# Patient Record
Sex: Male | Born: 1980 | Race: White | Hispanic: No
Health system: Midwestern US, Academic
[De-identification: ages and names within clinical notes are randomized; demographics above are authoritative.]

---

## 2018-08-10 ENCOUNTER — Encounter: Admit: 2018-08-10 | Discharge: 2018-08-11

## 2018-08-15 ENCOUNTER — Encounter: Admit: 2018-08-15 | Discharge: 2018-08-15

## 2018-08-15 ENCOUNTER — Ambulatory Visit: Admit: 2018-08-15 | Discharge: 2018-08-16

## 2018-08-15 DIAGNOSIS — M549 Dorsalgia, unspecified: Secondary | ICD-10-CM

## 2018-08-15 DIAGNOSIS — Z1159 Encounter for screening for other viral diseases: Secondary | ICD-10-CM

## 2018-08-15 DIAGNOSIS — S62616A Displaced fracture of proximal phalanx of right little finger, initial encounter for closed fracture: Principal | ICD-10-CM

## 2018-08-15 DIAGNOSIS — F419 Anxiety disorder, unspecified: Secondary | ICD-10-CM

## 2018-08-15 DIAGNOSIS — I82409 Acute embolism and thrombosis of unspecified deep veins of unspecified lower extremity: Secondary | ICD-10-CM

## 2018-08-15 DIAGNOSIS — F329 Major depressive disorder, single episode, unspecified: Secondary | ICD-10-CM

## 2018-08-15 NOTE — Progress Notes
Date of Service: 08/15/2018          History of Present Illness:  Willie Camacho is a pleasant 38 year old, right-hand dominant, self-employed Corporate investment banker.  He presents for evaluation of right small finger injury.  On 08/10/2018 he caught a football awkwardly injuring the finger.  He has had some previous metacarpal problems and describes a surgical procedure elsewhere more than five years ago.  He seemed to recover from this relatively well.  He was evaluated in the emergency department in [place], [place] and x-rays demonstrated a proximal phalanx fracture.  He has been using an AlumaFoam splint.  He notes no numbness or tingling.    Medical History:   Diagnosis Date   ??? Anxiety    ??? Back pain    ??? Deep vein thrombosis (HCC) 2018   ??? Depression        Surgical History:   Procedure Laterality Date   ??? ELBOW SURGERY  2015   ??? HAND SURGERY  2015       Family History   Problem Relation Age of Onset   ??? Diabetes Grandparent        Social History     Occupational History   ??? Not on file   Tobacco Use   ??? Smoking status: Current Some Day Smoker   ??? Smokeless tobacco: Never Used   Substance and Sexual Activity   ??? Alcohol use: Never     Frequency: Never   ??? Drug use: Never   ??? Sexual activity: Not on file       No Known Allergies        Review of Systems   Musculoskeletal: Positive for back pain.        Pain scale 5/10         Objective:         ??? bupropion HCl (WELLBUTRIN PO) Take  by mouth.   ??? busPIRone (BUSPAR) 5 mg tablet Take 5 mg by mouth three times daily.     Vitals:    08/15/18 1009   BP: 127/89   Pulse: 70   Weight: 74.8 kg (165 lb)   Height: 177.8 cm (70)     Body mass index is 23.68 kg/m???.     Physical Exam  Ortho Exam  Alert male, no acute distress.  He is appropriately dressed and interactive.  Oriented x 3.  On evaluation of the patient???s right hand, there is an obvious clinical deformity.  With composite flexion there is rotational deformity producing ulnar deviation of the finger.  The finger is well perfused.  Sensation is grossly intact to light touch in the median, ulnar, and radial nerve distributions.  Dorsal healed incision is present over the 5th metacarpal.    IMAGING:  X-rays obtained 08/10/2018 were reviewed.  There is a displaced oblique fracture of the proximal phalanx.       Assessment and Plan:    #1  Right small finger displaced proximal phalanx fracture    I reviewed with Willie Camacho that given the configuration of the fracture and his exam today, I would recommend that he consider open reduction internal fixation given the degree of rotational malalignment.  He was in agreement.  We reviewed the risks, benefits, goals, and alternatives to surgery.  He has indicated he would like to proceed.  He will be returned to an AlumaFoam splint today.  All of his questions were answered.  Dictated by Diego Cory, MD and transcribed via [employer]. Copied and pasted into O2 by Baldo Ash, 08/18/2018 9:47 AM

## 2018-08-15 NOTE — Progress Notes
Patient arrived to [facility] for COVID-19 testing 08/15/18 1131. Patient identity confirmed via photo I.D. Nasopharyngeal procedure explained to the patient.   Nasopharyngeal swab completed left  Patient education provided given and instructed patient self isolate until contacted w/ results and further instructions. CDC handout on COVID-19 given to patient.   [email or web address]    Swab collected by Leighton Parody, RN.    Date symptoms began/reason for testing: pre op

## 2018-08-15 NOTE — Pre-Anesthesia Patient Instructions
GENERAL INFORMATION    Before you coe to the hospital  ??? Make arrangeents for a responsible adult to drive you hoe and stay with you for 24 hours following surgery.  ??? Bath/Shower Instructions  ??? Please refer to Pre-Surgery Shower Instruction sheet.  ??? Leave oney, credit cards, jewelry, and any other valuables at hoe. [facility] is not responsible for the loss or breakage of personal ites.  ??? Reove nail polish fro surgery site/extreity, akeup and all jewelry (including piercings) before coing to the hospital.  ??? The orning of your procedure:  ??? brush your teeth and tongue  ??? do not soke  ??? do not shave the area where you will have surgery    What to bring to the hospital  ??? ID/ Insurance Card  ??? Medical Device card  ??? Official docuents for legal guardianship   ??? Copy of your Living Will, Advanced Directives, and/or Durable Power of Attorney   ??? Sall bag with a few personal belongings  ??? Cases for glasses/hearing aids/contact lens (bring solutions for contacts)  ??? Dress in clean, loose, cofortable clothing   ??? Other personal ites such as canes, walkers, and edications in original containers if applicable  ??? CPAP or BiPAP Machine: If it is a Philips/Respironics Syste One achine please bring achine/huidifier and tubing/ask. All other brands, please bring tubing/ask only on the day of surgery.     Eating or drinking before surgery  ??? Do not eat anything after 11:00 p.. the day before your procedure (including gu, ints, candy, or chewing tobacco).  ??? Other instructions: You ay have water until  * day of surgery.     Other instructions  Notify your surgeon if:  ??? there is a possibility that you are pregnant  ??? you becoe ill with a cough, fever, sore throat, nausea, voiting or flu-like syptos  ??? you have any open wounds/sores that are red, painful, draining, or are new since you last saw  the doctor  ??? you need to cancel your procedure Notify us at [facility]: [phone]   ??? if you need to cancel your procedure  ??? if you are going to be late    Arrival at the hospital  [place] - Your surgery is scheduled on 08-19-18 at 1315.  Please arrive at 1115.  ??? The [address] is located at [facility]. This is at the [facility] of [address]  ??? Use the ain entrance of the hospital, at the east side of the building. Parking is free.  ??? Check-in for surgery is inside the ain entrance.

## 2018-08-16 ENCOUNTER — Encounter: Admit: 2018-08-15 | Discharge: 2018-08-16

## 2018-08-16 DIAGNOSIS — S62616A Displaced fracture of proximal phalanx of right little finger, initial encounter for closed fracture: Principal | ICD-10-CM

## 2018-08-16 NOTE — Progress Notes
Contacted patient and confirmed name and DOB. Patient advised that COVID-19 test results are negative. Advised that patient can continue with the procedure and should follow pre-procedure instructions. Advised patient to continue with home quarantine until procedure. Advised that if they develop any concerning symptoms prior to the procedure to contact their procedure team, specialist, and/or PCP for assistance.

## 2018-08-18 ENCOUNTER — Encounter: Admit: 2018-08-18 | Discharge: 2018-08-18

## 2018-08-19 ENCOUNTER — Ambulatory Visit: Admit: 2018-08-19 | Discharge: 2018-08-19

## 2018-08-19 ENCOUNTER — Encounter: Admit: 2018-08-19 | Discharge: 2018-08-19

## 2018-08-19 ENCOUNTER — Encounter: Admit: 2018-08-19 | Discharge: 2018-08-20

## 2018-08-19 DIAGNOSIS — Z86718 Personal history of other venous thrombosis and embolism: Secondary | ICD-10-CM

## 2018-08-19 DIAGNOSIS — F419 Anxiety disorder, unspecified: Secondary | ICD-10-CM

## 2018-08-19 DIAGNOSIS — Z79899 Other long term (current) drug therapy: Secondary | ICD-10-CM

## 2018-08-19 DIAGNOSIS — M549 Dorsalgia, unspecified: Secondary | ICD-10-CM

## 2018-08-19 DIAGNOSIS — F17200 Nicotine dependence, unspecified, uncomplicated: Secondary | ICD-10-CM

## 2018-08-19 DIAGNOSIS — I82409 Acute embolism and thrombosis of unspecified deep veins of unspecified lower extremity: Secondary | ICD-10-CM

## 2018-08-19 DIAGNOSIS — S62616A Displaced fracture of proximal phalanx of right little finger, initial encounter for closed fracture: Principal | ICD-10-CM

## 2018-08-19 DIAGNOSIS — F329 Major depressive disorder, single episode, unspecified: Secondary | ICD-10-CM

## 2018-08-19 MED ORDER — LACTATED RINGERS IV SOLP
INTRAVENOUS | 0 refills | Status: DC
Start: 2018-08-19 — End: 2018-08-19
  Administered 2018-08-19: 16:00:00 1000.000 mL via INTRAVENOUS

## 2018-08-19 MED ORDER — OXYCODONE 5 MG PO TAB
5-10 mg | Freq: Once | ORAL | 0 refills | Status: DC | PRN
Start: 2018-08-19 — End: 2018-08-19

## 2018-08-19 MED ORDER — FENTANYL CITRATE (PF) 50 MCG/ML IJ SOLN
0 refills | Status: DC
Start: 2018-08-19 — End: 2018-08-19
  Administered 2018-08-19 (×2): 25 ug via INTRAVENOUS

## 2018-08-19 MED ORDER — FENTANYL CITRATE (PF) 50 MCG/ML IJ SOLN
25 ug | INTRAVENOUS | 0 refills | Status: DC | PRN
Start: 2018-08-19 — End: 2018-08-19

## 2018-08-19 MED ORDER — CEFAZOLIN 1 GRAM IJ SOLR
0 refills | Status: DC
Start: 2018-08-19 — End: 2018-08-19
  Administered 2018-08-19: 18:00:00 2 g via INTRAVENOUS

## 2018-08-19 MED ORDER — LIDOCAINE (PF) 200 MG/10 ML (2 %) IJ SYRG
0 refills | Status: DC
Start: 2018-08-19 — End: 2018-08-19
  Administered 2018-08-19: 18:00:00 100 mg via INTRAVENOUS

## 2018-08-19 MED ORDER — HYDROCODONE-ACETAMINOPHEN 5-325 MG PO TAB
1-2 | ORAL_TABLET | ORAL | 0 refills | 30.00000 days | Status: AC | PRN
Start: 2018-08-19 — End: ?
  Filled 2018-08-19: qty 30, 3d supply, fill #1

## 2018-08-19 MED ORDER — BUPIVACAINE 0.25 % (2.5 MG/ML) IJ SOLN
0 refills | Status: DC
Start: 2018-08-19 — End: 2018-08-19
  Administered 2018-08-19: 19:00:00 9 mL via INTRAMUSCULAR

## 2018-08-19 MED ORDER — LIDOCAINE (PF) 10 MG/ML (1 %) IJ SOLN
.2 mL | Freq: Once | INTRAMUSCULAR | 0 refills | Status: CP
Start: 2018-08-19 — End: ?
  Administered 2018-08-19: 16:00:00 0.2 mL via INTRAMUSCULAR

## 2018-08-19 MED ORDER — MIDAZOLAM 1 MG/ML IJ SOLN
INTRAVENOUS | 0 refills | Status: DC
Start: 2018-08-19 — End: 2018-08-19
  Administered 2018-08-19: 18:00:00 2 mg via INTRAVENOUS

## 2018-08-19 MED ORDER — PROPOFOL 10 MG/ML IV EMUL 20 ML (INFUSION)(AM)(OR)
INTRAVENOUS | 0 refills | Status: DC
Start: 2018-08-19 — End: 2018-08-19
  Administered 2018-08-19: 18:00:00 60 ug/kg/min via INTRAVENOUS

## 2018-08-19 MED ORDER — PROPOFOL INJ 10 MG/ML IV VIAL
0 refills | Status: DC
Start: 2018-08-19 — End: 2018-08-19
  Administered 2018-08-19: 18:00:00 30 mg via INTRAVENOUS

## 2018-08-19 MED ORDER — LACTATED RINGERS IV SOLP
1000 mL | INTRAVENOUS | 0 refills | Status: DC
Start: 2018-08-19 — End: 2018-08-19

## 2018-08-19 NOTE — Anesthesia Post-Procedure Evaluation
Post-Anesthesia Evaluation    Name: Willie Camacho      MRN: [id number]     DOB: 13-Oct-1980     Age: 38 y.o.     Sex: male   __________________________________________________________________________     Procedure Information     Anesthesia Start Date/Time:  08/19/18 1243    Procedure:  OPEN REDUCTION INTERNAL FIXATION RIGHT PROXIMAL PHALANX FRACTURE (Right Fingers)    Location:  [facility] OR 2 / [facility] OR/PERIOP    Surgeon:  Laveda Abbe, MD          Post-Anesthesia Vitals  BP: 125/94 (08/11 1415)  Temp: 36.7 C (98 F) (08/11 1350)  Pulse: 69 (08/11 1415)  Respirations: 22 PER MINUTE (08/11 1415)  SpO2: 98 % (08/11 1415)  SpO2 Pulse: 72 (08/11 1415)  Height: 172.7 cm (68") (08/11 1030)   Vitals Value Taken Time   BP 125/94 08/19/2018  2:15 PM   Temp 36.7 C (98 F) 08/19/2018  1:50 PM   Pulse 69 08/19/2018  2:15 PM   Respirations 22 PER MINUTE 08/19/2018  2:15 PM   SpO2 98 % 08/19/2018  2:15 PM         Post Anesthesia Evaluation Note    Evaluation location: Pre/Post  Patient participation: recovered; patient participated in evaluation  Level of consciousness: alert    Pain score: 2  Pain management: adequate    Hydration: normovolemia  Temperature: 36.0C - 38.4C  Airway patency: adequate    Perioperative Events       Post-op nausea and vomiting: no PONV    Postoperative Status  Cardiovascular status: hemodynamically stable  Respiratory status: spontaneous ventilation  Follow-up needed: none        Perioperative Events  Perioperative Event: No  Emergency Case Activation: No

## 2018-08-19 NOTE — Care Plan
Goals ongoing.

## 2018-08-19 NOTE — Patient Instructions
Discharge Instructions: After Your Surgery  You've just had surgery. During surgery, you were given medicine called anesthesia to keep you relaxed and free of pain. After surgery, you may have some pain or nausea. This is common. Here are some tips for feeling better and getting well after surgery.     Stay on schedule with your medicine.   Going home  Your healthcare provider will show you how to take care of yourself when you go home. He or she will also answer your questions. Have an adult family member or friend drive you home. For the first 24 hours after your surgery:   Don't drive or use heavy equipment.   Don't make important decisions or sign legal papers.   Don't drink alcohol.   Have someone stay with you, if needed. He or she can watch for problems and help keep you safe.  Be sure to go to all follow-up visits with your healthcare provider. And rest after your surgery for as long as your healthcare provider tells you to.  Coping with pain  If you have pain after surgery, pain medicine will help you feel better. Take it as told, before pain becomes severe. Also, ask your healthcare provider or pharmacist about other ways to control pain. This might be with heat, ice, or relaxation. And follow any other instructions your surgeon or nurse gives you.  Tips for taking pain medicine  To get the best relief possible, remember these points:   Pain medicines can upset your stomach. Taking them with a little food may help.   Most pain relievers taken by mouth need at least 20 to 30 minutes to start to work.   Don't wait till your pain becomes severe before you take your medicine. Try to time your medicine so that you can take it before starting an activity. This might be before you get dressed, go for a walk, or sit down for dinner.   Constipation is a common side effect of pain medicines. Call your healthcare provider before taking any medicines such as laxatives or stool softeners to help ease  constipation. Also ask if you should skip any foods. Drinkinglots of fluids andeating foodssuch as fruits and vegetables that are high in fiber can also help. Remember, don't take laxatives unless your surgeon has prescribed them.   Drinking alcohol and taking pain medicine can cause dizziness and slow your breathing. It can even be deadly. Don't drink alcohol while taking pain medicine.   Pain medicine can make you react more slowly to things. Don't drive or run machinery while taking pain medicine.  Your healthcare providermay tell you to take acetaminophen to help ease your pain. Ask him or her how much you are supposed to take each day. Acetaminophen or other pain relievers may interact with your prescription medicines or other over-the-counter (OTC) medicines. Some prescription medicines have acetaminophen and other ingredients.Using both prescription and OTC acetaminophenfor paincan cause you to overdose. Readthe labels on your OTC medicineswith care. This will help youto clearly know the list of ingredients, how much to take, and anywarnings. It may also help you not take too muchacetaminophen.If you have questions or don't understand the information, ask your pharmacist or healthcare provider to explain it to you before you take the OTC medicine.  Managing nausea  Some people have an upset stomach after surgery. This is often because of anesthesia, pain, or pain medicine, or the stress of surgery. These tips will help you handle nausea and eat   healthy foods as you get better. If you were on a special food plan before surgery, ask your healthcare provider if you should follow it while you get better. These tips may help:   Don't push yourself to eat. Your body will tell you when to eat and how much.   Start off with clear liquids and soup. They are easier to digest.   Next try semi-solid foods, such as mashed potatoes, applesauce, and gelatin, as you feel ready.   Slowly move to solid  foods. Don't eat fatty, rich, or spicy foods at first.   Don't force yourself to have 3 large meals a day. Instead eat smaller amounts more often.   Take pain medicines with a small amount of solid food, such as crackers or toast, to prevent nausea.  When to call your healthcare provider  Call your healthcare provider if:   You still have intolerable pain an hour after taking medicine. The medicine may not be strong enough.   You feel too sleepy, dizzy, or groggy. The medicine may be too strong.   You have side effects such as nausea or vomiting, or skin changes such as rash, itching, or hives.Your healthcare provider may suggest other medicines to control side effects.  Rash, itching, or hives may mean you have an allergic reaction. Report this right away. If you have trouble breathing or facial swelling, call 911 right away.  If you have obstructive sleep apnea  You were given anesthesia medicine during surgery to keep you comfortable and free of pain. After surgery, you may have more apnea spells because of this medicine and other medicines you were given. The spells may last longer than usual.  At home:   Keep using the continuous positive airway pressure (CPAP) device when you sleep. Unless your healthcare provider tells you not to, use it when you sleep, day or night. CPAP is a common device used to treat obstructive sleep apnea.   Talk with your provider before taking any pain medicine, muscle relaxants, or sedatives. Your provider will tell you about the possible dangers of taking these medicines.  [employer] last reviewed this educational content on 03/08/2017   2000-2020 [employer] [address], [place] [address]. All rights reserved. This information is not intended as a substitute for professional medical care. Always follow your healthcare professional's instructions.

## 2018-08-19 NOTE — Anesthesia Pre-Procedure Evaluation
Anesthesia Pre-Procedure Evaluation    Name: Willie Camacho      MRN: [id number]     DOB: 1980-04-24     Age: 38 y.o.     Sex: male   _________________________________________________________________________     Procedure Info:   Procedure Information     Date/Time:  08/19/18 1229    Procedure:  OPEN REDUCTION INTERNAL FIXATION RIGHT PROXIMAL PHALANX FRACTURE (Right )    Location:  [facility]    Surgeon:  Diego Cory, MD          Physical Assessment  Vital Signs (last filed in past 24 hours):  BP: 128/95 (08/11 1050)  Temp: 36.8 ???C (98.2 ???F) (08/11 1030)  Pulse: 78 (08/11 1030)  Respirations: 20 PER MINUTE (08/11 1030)  SpO2: 96 % (08/11 1030)  Height: 172.7 cm (68) (08/11 1030)  Weight: 75.8 kg (167 lb) (08/11 1030)      Patient History   No Known Allergies     Current Medications    Medication Directions   buPROPion XL (WELLBUTRIN XL) 150 mg tablet Take 150 mg by mouth every morning. Do not crush or chew.   busPIRone (BUSPAR) 5 mg tablet Take 5 mg by mouth three times daily.   tiZANidine (ZANAFLEX) 4 mg tablet Take 4 mg by mouth every 8 hours as needed for Spasms.   traZODone (DESYREL) 50 mg tablet Take 50 mg by mouth at bedtime as needed for Sleep.         Review of Systems/Medical History      Patient summary reviewed  Pertinent labs reviewed    PONV Screening: Postoperative opioids      Airway - negative        Pulmonary       Current smoker; patient did not smoke on day of surgery        Cardiovascular         Exercise tolerance: >4 METS      Beta Blocker therapy: No      Beta blockers within 24 hours: n/a      DVT      GI/Hepatic/Renal - negative        Neuro/Psych           Psychiatric history          Depression      Musculoskeletal         Back pain      Endocrine/Other - negative     Physical Exam    Airway Findings      Mallampati: II      TM distance: >3 FB      Neck ROM: full      Mouth opening: good      Airway patency: adequate    Dental Findings: Comments: No loose, chipped, or missing teeth.    Cardiovascular Findings: Negative      Pulmonary Findings: Negative      Abdominal Findings: Negative      Neurological Findings: Negative      Constitutional findings: Negative       Diagnostic Tests  Hematology: No results found for: HGB, HCT, PLTCT, WBC, NEUT, ANC, LYMPH, ALC, ABSLYMPHCT, MONA, AMC, EOSA, ABC, BASOPHILS, MCV, MCH, MCHC, MPV, RDW      General Chemistry: No results found for: NA, K, CL, CO2, GAP, BUN, CR, GLU, CA, KETONES, ALBUMIN, LACTIC, OBSCA, MG, TOTBILI, TOTBILCB, PO4   Coagulation: No results found for: PT, PTT, INR  Anesthesia Plan    ASA score: 1   Plan: MAC  Induction method: intravenous  NPO status: acceptable      Informed Consent  Anesthetic plan and risks discussed with patient.  Use of blood products discussed with patient  Blood Consent: consented      Plan discussed with: anesthesiologist and CRNA.  Comments: (Pt seen and identified in holding, history and physical performed, all questions answered. Plan for mac + digit block by surgeon.  Pt understands the possibility of converting to GA as backup. Risks of GA with LMA/ETT including sore throat, oral/dental injury, allergic reactions, PONV, aspiration, respiratory failure, MI, CVA discussed with patient who reports understanding and consents to anesthetic plan.)

## 2018-08-19 NOTE — Other
Brief Operative Note    Name: Willie Camacho is a 38 y.o. male     DOB: 11-27-1980             MRN#: [id number]  DATE OF OPERATION: 08/19/2018    Date:  08/19/2018        Preoperative Dx:   Closed displaced fracture of proximal phalanx of right little finger, initial encounter [S62.616A]    Post-op Diagnosis      * Closed displaced fracture of proximal phalanx of right little finger, initial encounter [S62.616A]    Procedure(s) (LRB):  OPEN REDUCTION INTERNAL FIXATION RIGHT PROXIMAL PHALANX FRACTURE (Right)    Anesthesia Type: Defer to Anesthesia    Surgeon(s) and Role:     * Laveda Abbe, MD - Primary     * Sherlon Handing, PA-C - Assisting     Evonnie Dawes, MD - Resident - Assisting      Findings:  Small finger proximal phalanx fracture    Estimated Blood Loss: No blood loss documented.     Specimen(s) Removed/Disposition: * No specimens in log *    Complications:  None    Implants: screws x 2    Drains: None    Disposition:  PACU - stable    Sherlon Handing, PA-C  Pager [phone]

## 2018-08-19 NOTE — H&P (View-Only)
[facility] History & Physical Note      Admission Date: 08/19/2018                                                  Chief Complaint:  Right small finger displaced proximal phalanx fracture    Assessment/Plan    Plan to proceed to OR today for ORIF right small finger proximal phalanx fracture  ______________________________________________________________________    History of Present Illness:Mr. Willie Camacho is a pleasant 38 year old, right-hand dominant, self-employed Corporate investment banker.  He presents for evaluation of right small finger injury.  On 08/10/2018 he caught a football awkwardly injuring the finger.  He has had some previous metacarpal problems and describes a surgical procedure elsewhere more than five years ago.  He seemed to recover from this relatively well.  He was evaluated in the emergency department in [place], [place] and x-rays demonstrated a proximal phalanx fracture.  He has been using an AlumaFoam splint.  He notes no numbness or tingling.    Medical History:   Diagnosis Date   ??? Anxiety    ??? Back pain    ??? Deep vein thrombosis (HCC) 2018   ??? Depression        Surgical History:   Procedure Laterality Date   ??? ELBOW SURGERY  2015   ??? HAND SURGERY  2015       Social History     Socioeconomic History   ??? Marital status: Divorced     Spouse name: Not on file   ??? Number of children: Not on file   ??? Years of education: Not on file   ??? Highest education level: Not on file   Occupational History   ??? Not on file   Tobacco Use   ??? Smoking status: Current Some Day Smoker     Years: 4.00   ??? Smokeless tobacco: Never Used   Substance and Sexual Activity   ??? Alcohol use: Never     Frequency: Never   ??? Drug use: Never   ??? Sexual activity: Not on file   Other Topics Concern   ??? Not on file   Social History Narrative   ??? Not on file       Family History   Problem Relation Age of Onset   ??? Diabetes Grandparent        Allergies: No Known Allergies      Outpatient Medications as of 08/19/2018 Medication Sig Dispense Refill   ??? buPROPion XL (WELLBUTRIN XL) 150 mg tablet Take 150 mg by mouth every morning. Do not crush or chew.     ??? tiZANidine (ZANAFLEX) 4 mg tablet Take 4 mg by mouth every 8 hours as needed for Spasms.     ??? traZODone (DESYREL) 50 mg tablet Take 50 mg by mouth at bedtime as needed for Sleep.           Review of Systems:  Musculoskeletal:positive for bone pain  Cardio: denies chest pain  Respiratory: denies shortness of breath     Blood pressure (!) 128/95, pulse 78, temperature 36.8 ???C (98.2 ???F), height 172.7 cm (68), weight 75.8 kg (167 lb), SpO2 96 %.    Physical Exam:    Alert male, no acute distress.  He is appropriately dressed and interactive.  Oriented x 3.  On evaluation of the patient???s right hand, there  is an obvious clinical deformity.  With composite flexion there is rotational deformity producing ulnar deviation of the finger.  The finger is well perfused.  Sensation is grossly intact to light touch in the median, ulnar, and radial nerve distributions.  Dorsal healed incision is present over the 5th metacarpal.    Lab/Radiology/Other Diagnostic Tests:    Radiographs:     CBC w/Diff   No results found for: WBC, HGB, HCT, PLTCT        Basic Metabolic Profile   No results found for: NA, K, CL, CO2, GAP, BUN, CR, GLU     Coagulation Studies   No results found for: PT, PTT, INR       Willie Camacho A Willie Pitstick, PA-C

## 2018-08-23 ENCOUNTER — Encounter: Admit: 2018-08-23 | Discharge: 2018-08-23

## 2018-08-26 ENCOUNTER — Encounter: Admit: 2018-08-26 | Discharge: 2018-08-26

## 2018-08-26 DIAGNOSIS — S62616A Displaced fracture of proximal phalanx of right little finger, initial encounter for closed fracture: Secondary | ICD-10-CM

## 2018-08-27 ENCOUNTER — Encounter: Admit: 2018-08-27 | Discharge: 2018-08-27

## 2018-08-27 ENCOUNTER — Ambulatory Visit: Admit: 2018-08-27 | Discharge: 2018-08-27

## 2018-08-27 DIAGNOSIS — F419 Anxiety disorder, unspecified: Secondary | ICD-10-CM

## 2018-08-27 DIAGNOSIS — I82409 Acute embolism and thrombosis of unspecified deep veins of unspecified lower extremity: Secondary | ICD-10-CM

## 2018-08-27 DIAGNOSIS — F329 Major depressive disorder, single episode, unspecified: Secondary | ICD-10-CM

## 2018-08-27 DIAGNOSIS — S62616A Displaced fracture of proximal phalanx of right little finger, initial encounter for closed fracture: Secondary | ICD-10-CM

## 2018-08-27 DIAGNOSIS — S62616D Displaced fracture of proximal phalanx of right little finger, subsequent encounter for fracture with routine healing: Secondary | ICD-10-CM

## 2018-08-27 DIAGNOSIS — M549 Dorsalgia, unspecified: Secondary | ICD-10-CM

## 2018-08-28 ENCOUNTER — Encounter: Admit: 2018-08-28 | Discharge: 2018-08-28

## 2018-08-28 DIAGNOSIS — F419 Anxiety disorder, unspecified: Secondary | ICD-10-CM

## 2018-08-28 DIAGNOSIS — M549 Dorsalgia, unspecified: Secondary | ICD-10-CM

## 2018-08-28 DIAGNOSIS — F329 Major depressive disorder, single episode, unspecified: Secondary | ICD-10-CM

## 2018-08-28 DIAGNOSIS — I82409 Acute embolism and thrombosis of unspecified deep veins of unspecified lower extremity: Secondary | ICD-10-CM

## 2018-08-28 NOTE — Progress Notes
Hand Therapy Orthosis/Evaluation and Plan of Care:     Name:Willie Camacho  DOB:[birth year]  MRN#: [id number]  Referring Physician:T Caryn Section  Insurance: self pay  Injury/Onset Date:   Surgical Date: 08/19/2018   Medical Diagnosis: Right small finger displaced proximal phalanx fracture  Treatment Diagnosis: same  Date of Initial Evaluation: 08/27/2018  Follow-up Physician Appointment:  Visit # 1      Subjective:     History of Present Condition/Mechanism of Injury:    Pain: Right:  Location: Fingers: Little    Pain Rating:   Current: 6/10   Comments: I can't move my finger because it hurts.      Patient Goals:      Objective:     Evaluation:    Hand Exam     Treatment:     Right:   Fingers: Little     Treatment Provided:    Therapeutic Exercise: AROM             Orthosis:     Prefabricated Orthosis Issued: Buddy Straps/Fingers     Purpose of Immobilization:To protect during activity    Orthosis Instructions:  Verbally instructed in orthosis/buddy straps care and precautions. Verbally instructed in wearing schedule for buddy straps. At all times and Except hygiene.    Assessment:     Problems: Bony Instability    Rationale for Therapy:Clinical Judgment and Per Physician Order/Referral    Rehab Potential: Good     Contraindications to Therapy:none    Long Term Treatment Goals:    Increase AROM while decreasing stiffness, swelling and pain to improve functional performance.  Protect with buddy straps during healing process to enable patient to resume functional activities post immobilization.    Short Term Treatment Goals:      Goal Number:  1  Goal: Patient to verbalize understanding of buddy strap  instructions including wearing schedule and precautions.  Goal Status:  Met   Goal Number:  2  Goal: Patient to verbalize/demonstrate exercises correctly.  Goal Status:  Met     Plan Of Care:    Treatment Plan:     Treatment to be provided:Issue Prefabricated Orthosis  Exercise: AROM Frequency of treatment:  Will follow per physician request, To follow home program

## 2018-08-29 NOTE — Progress Notes
Date of Service:  08/27/2018      Subjective:     Mr. Brannigan returns to clinic now eight days postop.  He is pleased by the improved alignment of the finger.  He has some stiffness as expected.    Objective:  Ortho Exam  On evaluation of the patients right small finger, his wound is healing nicely.  The clinical alignment is appropriate without rotational malalignment today.  FDP function is intact.  The extensor mechanism seems to be intact.  He has some stiffness and is somewhat jumpy on exam.  Sensation is intact at the distal aspect of the small finger.    IMAGING:  X-rays were reviewed.  No obvious change from intraoperatively.    I reviewed with Mr. Culp that I think he may begin working on finger range of motion with the protection of a buddy strap.  He will return in approximately a week for suture removal.  No new x-rays need be obtained at that point.  All of his questions were answered.         Dictated by Laveda Abbe, MD and transcribed via [employer]. Copied and pasted into [facility] by Sonny Masters, 08/29/2018 2:16 PM

## 2018-09-03 ENCOUNTER — Encounter: Admit: 2018-09-03 | Discharge: 2018-09-03

## 2018-09-03 ENCOUNTER — Ambulatory Visit: Admit: 2018-09-03 | Discharge: 2018-09-04

## 2018-09-03 DIAGNOSIS — F419 Anxiety disorder, unspecified: Secondary | ICD-10-CM

## 2018-09-03 DIAGNOSIS — F329 Major depressive disorder, single episode, unspecified: Secondary | ICD-10-CM

## 2018-09-03 DIAGNOSIS — I82409 Acute embolism and thrombosis of unspecified deep veins of unspecified lower extremity: Secondary | ICD-10-CM

## 2018-09-03 DIAGNOSIS — S62616D Displaced fracture of proximal phalanx of right little finger, subsequent encounter for fracture with routine healing: Secondary | ICD-10-CM

## 2018-09-03 DIAGNOSIS — M549 Dorsalgia, unspecified: Secondary | ICD-10-CM

## 2018-09-03 NOTE — Progress Notes
Willie Camacho returns to clinic now two weeks postop for suture removal. He has been wearing buddy straps and working on his finger range of motion as instructed.     On evaluation of the patients right small finger, his wound is nicely healed  The clinical alignment is appropriate without rotational malalignment today.  FDP function is intact.  The extensor mechanism seems to be intact. Sensation is intact at the distal aspect of the small finger.    I reviewed with Willie Camacho that his sutures will be removed today.  He will continue to work on his finger range of motion with the protection of buddy straps. He will follow up in 4 weeks with repeat x-rays.  All of his questions were answered.

## 2018-09-08 ENCOUNTER — Encounter: Admit: 2018-08-27 | Discharge: 2018-09-08

## 2018-09-08 DIAGNOSIS — S62616A Displaced fracture of proximal phalanx of right little finger, initial encounter for closed fracture: Principal | ICD-10-CM

## 2018-09-29 ENCOUNTER — Emergency Department: Admit: 2018-09-29 | Discharge: 2018-09-29

## 2018-09-29 ENCOUNTER — Encounter: Admit: 2018-09-29 | Discharge: 2018-09-29

## 2018-09-29 ENCOUNTER — Emergency Department: Admit: 2018-09-29 | Discharge: 2018-09-30 | Disposition: A | Discharge status: Home / Self Care

## 2018-09-29 LAB — URINALYSIS DIPSTICK REFLEX TO CULTURE
Lab: NEGATIVE K/UL (ref 3–12)
Lab: NEGATIVE MMOL/L (ref 21–30)
Lab: NEGATIVE U/L (ref 25–110)
Lab: NEGATIVE g/dL (ref 3.5–5.0)
Lab: NEGATIVE g/dL (ref 6.0–8.0)
Lab: NEGATIVE mg/dL (ref 0.3–1.2)

## 2018-09-29 LAB — URINALYSIS MICROSCOPIC REFLEX TO CULTURE

## 2018-09-29 LAB — CBC AND DIFF: Lab: 7.1 10*3/uL (ref 4.5–11.0)

## 2018-09-29 LAB — COMPREHENSIVE METABOLIC PANEL: Lab: 137 MMOL/L (ref 137–147)

## 2018-09-29 LAB — POC CREATININE, RAD: Lab: 1.1 mg/dL (ref 0.4–1.24)

## 2018-09-29 LAB — LIPASE: Lab: 20 U/L (ref 11–82)

## 2018-09-29 MED ORDER — IOHEXOL 350 MG IODINE/ML IV SOLN
100 mL | Freq: Once | INTRAVENOUS | 0 refills | Status: CP
Start: 2018-09-29 — End: ?
  Administered 2018-09-29: 21:00:00 100 mL via INTRAVENOUS

## 2018-09-29 MED ORDER — FAMOTIDINE 20 MG PO TAB
20 mg | ORAL_TABLET | Freq: Two times a day (BID) | ORAL | 0 refills | 90.00000 days | Status: DC
Start: 2018-09-29 — End: 2018-09-30

## 2018-09-29 MED ORDER — KETOROLAC 15 MG/ML IJ SOLN
15 mg | Freq: Once | INTRAVENOUS | 0 refills | Status: CP
Start: 2018-09-29 — End: ?
  Administered 2018-09-29: 20:00:00 15 mg via INTRAVENOUS

## 2018-09-29 MED ORDER — FAMOTIDINE 20 MG PO TAB
20 mg | ORAL_TABLET | Freq: Two times a day (BID) | ORAL | 0 refills | 90.00000 days | Status: AC
Start: 2018-09-29 — End: ?

## 2018-09-29 MED ORDER — SODIUM CHLORIDE 0.9 % IJ SOLN
50 mL | Freq: Once | INTRAVENOUS | 0 refills | Status: DC
Start: 2018-09-29 — End: 2018-09-30

## 2018-09-29 NOTE — ED Notes
Willie Camacho is a 38 yo male that presents to the ED for lower back pain that wraps into his scrotum, sternal pain, and midline abdominal pain. He states this began 5 months ago, but has progressively gotten worse. He states he believes this is "all related." His abdomen is tender to palpation. He states he hasn't had much of an appetite because he "feels full constantly." He reports no issues with voiding and has had regular bowel movements. He reports he has been seen for this same pain at his PCP office, but has not had any form of scans or lab work. He reports a hx of alcoholism, but has been sober for 56 days. He is A&O X4, sitting on cart in room in lowest locked position, on full monitor and in no distress at this time.     Belongings:  -white tshirt  -pants  -black shoes  -cell phone    All belongings with Pt at bedside.

## 2018-09-30 ENCOUNTER — Encounter: Admit: 2018-09-30 | Discharge: 2018-09-30

## 2018-09-30 LAB — POC TROPONIN: Lab: 0 ng/mL (ref 0.00–0.05)

## 2018-09-30 NOTE — ED Notes
ED discharge instructions and Rx provided to Pt. Pt verbalizes understanding. Pt ambulated out of ED with steady gait with all belongings.

## 2018-10-01 ENCOUNTER — Encounter: Admit: 2018-10-01 | Discharge: 2018-10-01

## 2018-10-01 ENCOUNTER — Ambulatory Visit: Admit: 2018-10-01 | Discharge: 2018-10-01

## 2018-10-16 NOTE — Telephone Encounter
Pre Visit Planning- New Patient    Records received: No    Orders have been NA    Patient inactive in [place]..     Left voicemail message.    Updated chart: Not assessed    Not sure if patient has any imaging

## 2018-10-20 ENCOUNTER — Encounter: Admit: 2018-10-20 | Discharge: 2018-10-20

## 2018-10-20 ENCOUNTER — Ambulatory Visit: Admit: 2018-10-20 | Discharge: 2018-10-20

## 2018-10-20 DIAGNOSIS — M5416 Radiculopathy, lumbar region: Secondary | ICD-10-CM

## 2018-10-20 DIAGNOSIS — S32009S Unspecified fracture of unspecified lumbar vertebra, sequela: Secondary | ICD-10-CM

## 2018-10-20 DIAGNOSIS — F329 Major depressive disorder, single episode, unspecified: Secondary | ICD-10-CM

## 2018-10-20 DIAGNOSIS — I82409 Acute embolism and thrombosis of unspecified deep veins of unspecified lower extremity: Secondary | ICD-10-CM

## 2018-10-20 DIAGNOSIS — F419 Anxiety disorder, unspecified: Secondary | ICD-10-CM

## 2018-10-20 DIAGNOSIS — S32030S Wedge compression fracture of third lumbar vertebra, sequela: Secondary | ICD-10-CM

## 2018-10-20 DIAGNOSIS — M549 Dorsalgia, unspecified: Secondary | ICD-10-CM

## 2018-10-20 NOTE — Patient Instructions
Rosalina Dingwall RN, BSN  Clinical Nurse Coordinator-[facility] - [facility]  [facility] - Neurosurgery- Dr. Justin Davis & Dr Ifije Ohiorhenuan  Ph: [phone] - Fax: [phone] - Email:[email or web address]

## 2018-10-20 NOTE — Progress Notes
Date of Service: 10/20/2018     Subjective:               History of Present Illness    Willie Camacho is a 38 y.o. male.  He presents the chief complaint of right-sided low back pain radiating into his groin and right testicle.  This is been going on for at least 4 months and getting progressively worse.  His symptoms are worse with activity improved with rest.  He has failed nonoperative management with chiropractic care, pain medications, muscle relaxants, and anti-inflammatories.       Review of Systems   Constitutional: Positive for fatigue.   HENT: Negative.    Eyes: Negative.    Respiratory: Negative.    Cardiovascular: Negative.    Gastrointestinal: Negative.    Endocrine: Positive for polyuria.   Genitourinary: Positive for flank pain and testicular pain.   Musculoskeletal: Positive for arthralgias, back pain and myalgias.   Skin: Negative.    Allergic/Immunologic: Negative.    Neurological: Negative.    Hematological: Negative.    Psychiatric/Behavioral: Negative.          Objective:         ? buPROPion XL (WELLBUTRIN XL) 150 mg tablet Take 150 mg by mouth every morning. Do not crush or chew.   ? busPIRone (BUSPAR) 5 mg tablet Take 5 mg by mouth three times daily.   ? famotidine (PEPCID) 20 mg tablet Take one tablet by mouth twice daily. Indications: a stomach ulcer   ? HYDROcodone/acetaminophen (NORCO) 5/325 mg tablet Take one tablet to two tablets by mouth every 4 hours as needed   ? tiZANidine (ZANAFLEX) 4 mg tablet Take 4 mg by mouth every 8 hours as needed for Spasms.   ? traZODone (DESYREL) 50 mg tablet Take 50 mg by mouth at bedtime as needed for Sleep.     Vitals:    10/20/18 1300   SpO2: 99%   Height: 172.7 cm (68)   PainSc: Eight     Body mass index is 25.09 kg/m?Marland Kitchen       Physical Exam  Vitals signs and nursing note reviewed.   Constitutional:       Appearance: Normal appearance.   HENT:      Head: Normocephalic and atraumatic.      Right Ear: External ear normal. Left Ear: External ear normal.      Nose: Nose normal.   Eyes:      Conjunctiva/sclera: Conjunctivae normal.   Neck:      Musculoskeletal: Normal range of motion and neck supple.   Pulmonary:      Effort: Pulmonary effort is normal.   Musculoskeletal: Normal range of motion.      Cervical back: Normal.      Thoracic back: Normal.      Lumbar back: He exhibits pain.   Skin:     General: Skin is warm and dry.   Neurological:      General: No focal deficit present.      Mental Status: He is alert and oriented to person, place, and time.      Cranial Nerves: Cranial nerves are intact.      Sensory: Sensation is intact.      Motor: Motor function is intact.      Coordination: Coordination is intact.      Gait: Gait is intact.      Deep Tendon Reflexes: Reflexes are normal and symmetric.   Psychiatric:  Mood and Affect: Mood normal.         Behavior: Behavior normal.         Thought Content: Thought content normal.         Judgment: Judgment normal.       CT of the lumbar spine as well as plain films of the lumbar spine were independently reviewed by me.  These demonstrate a subacute to chronic looking bilateral fracture of the L3 pedicles, but otherwise no significant degenerative changes.  There is normal alignment without abnormal motion on flexion/extension views.       Assessment and Plan:  While patient does have subacute/chronic bilateral L3 pedicle fractures, his symptoms correlate more to an L1 or L2 nerve root.  I am ordering an MRI of the lumbar spine to further evaluate the cause of his symptoms.

## 2020-03-12 IMAGING — CR UP_EXM
3 series · 3 of 3 positions shown · non-contrast
Comparison: none

[finger]
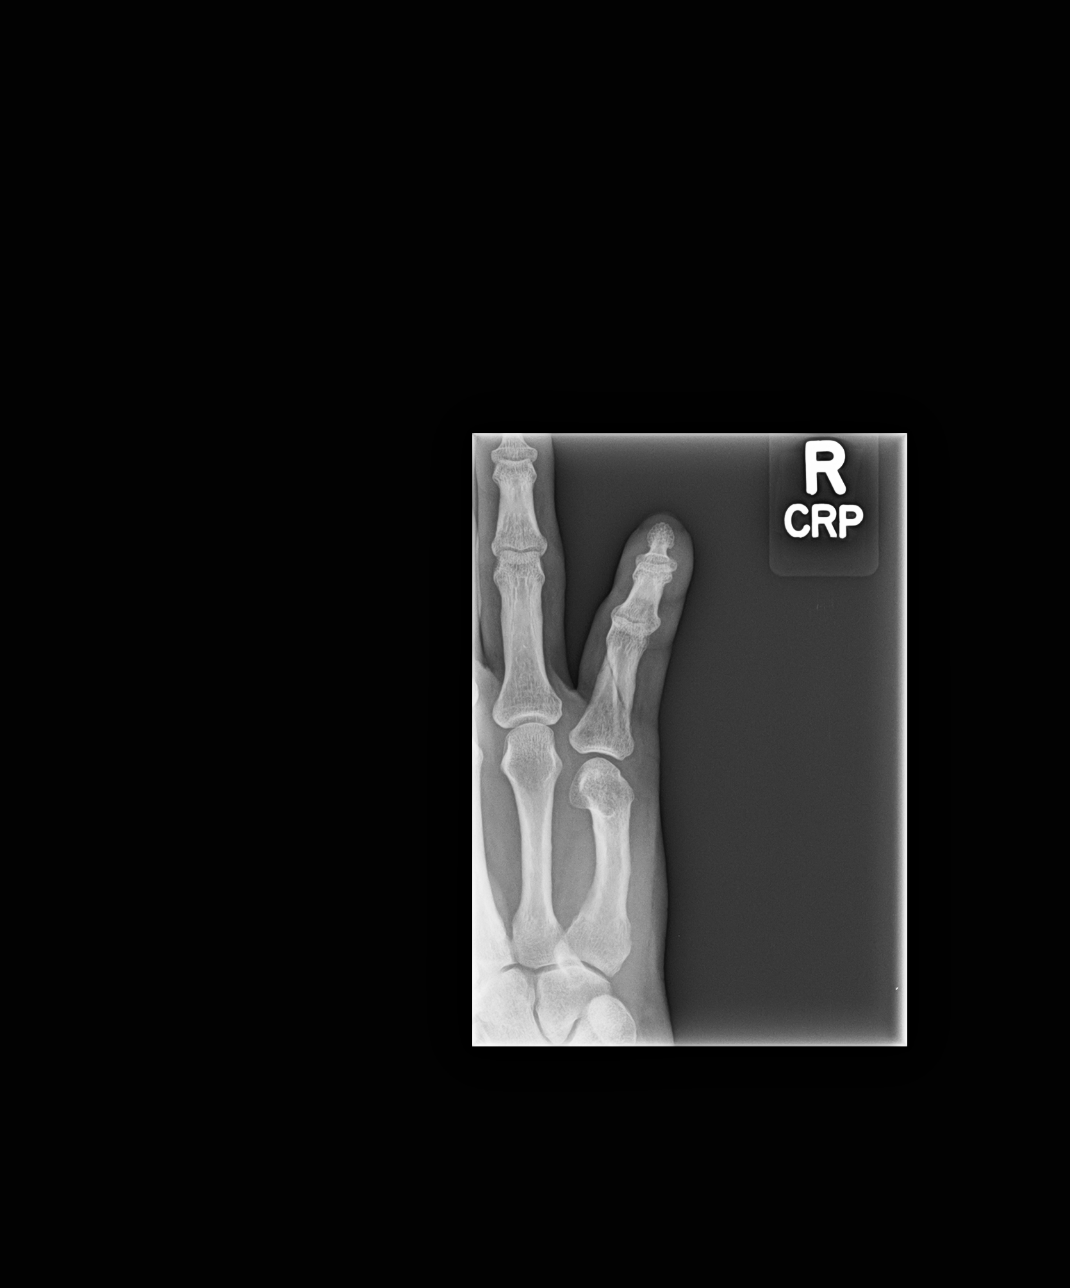

[finger obl]
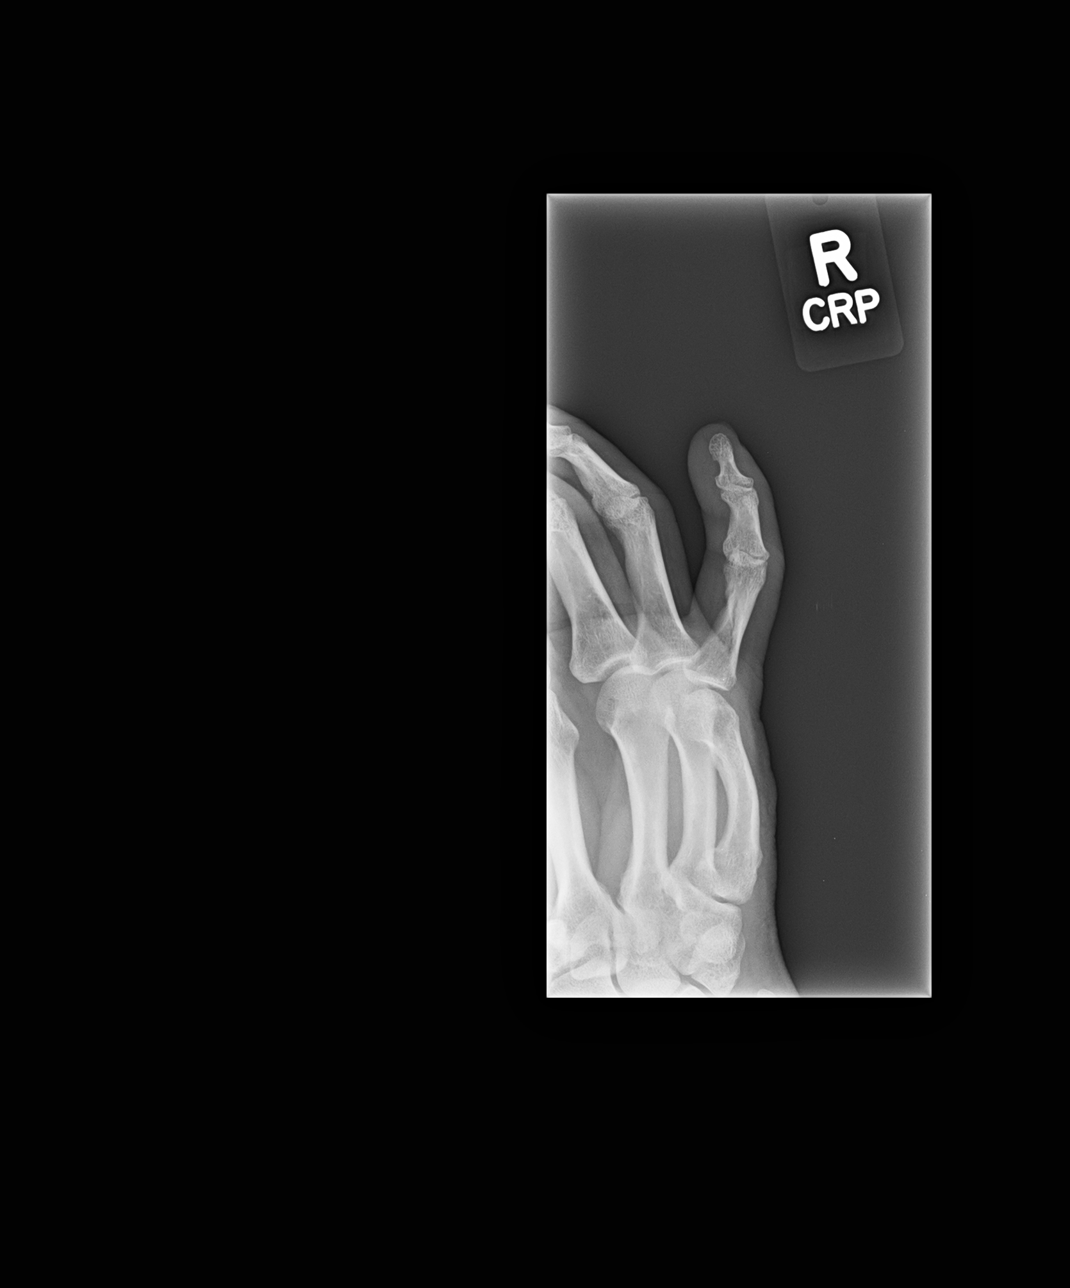

[finger lat]
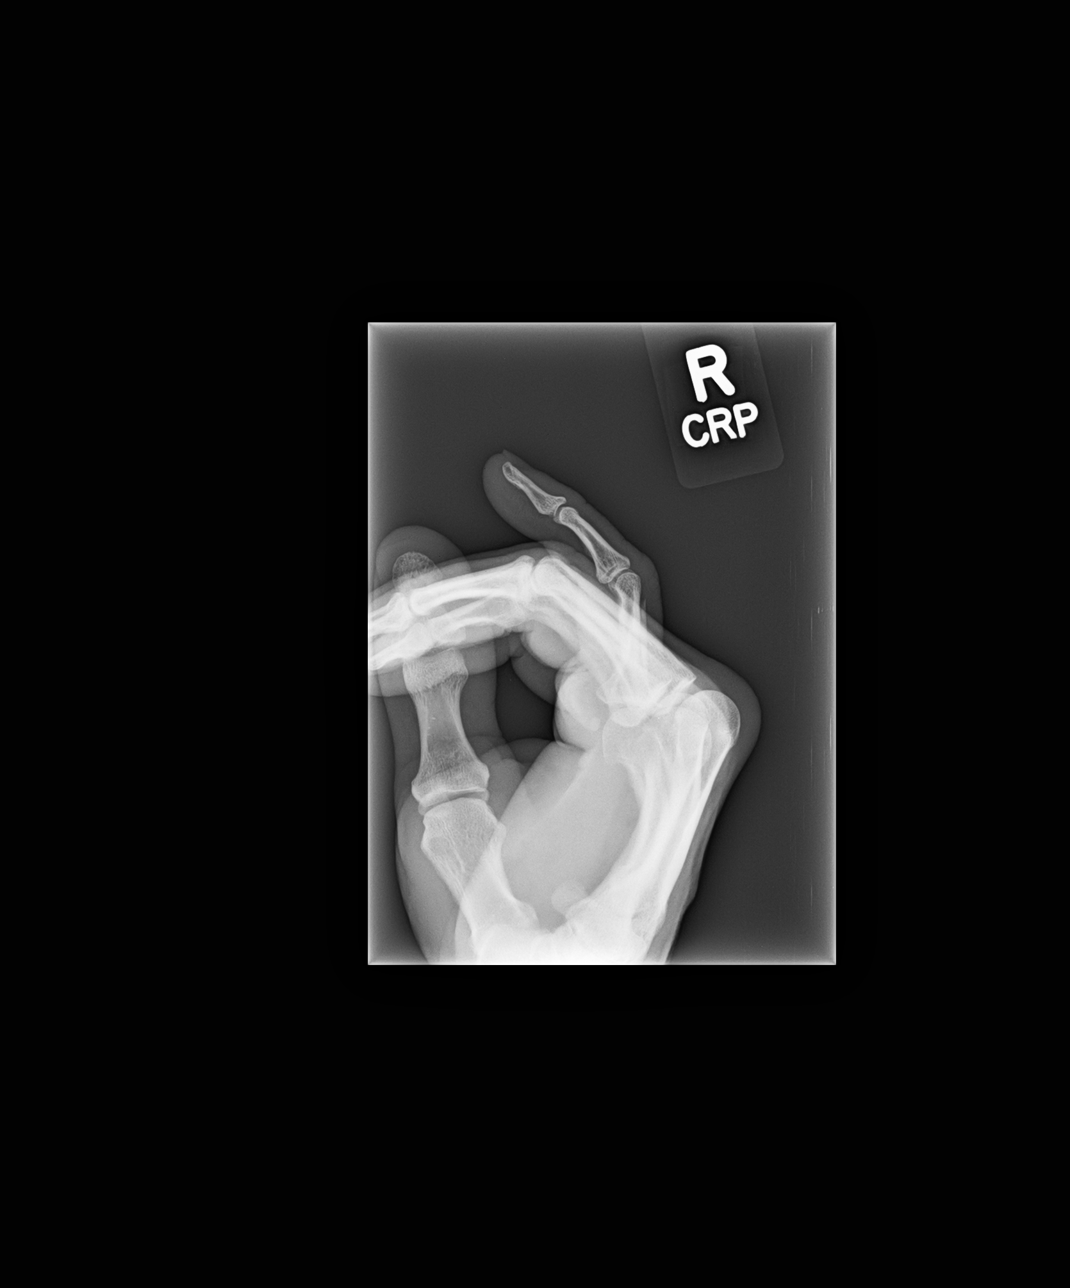

[3 of 3 positions shown; findings below may reference images not displayed]

DIAGNOSTIC STUDIES

EXAM

Right finger radiographs.

INDICATION

5th finger pain
patient injured 5th right finger while playing basketball. hx of multiple hand/finger fxs    pt
shielded

TECHNIQUE

Three views of the right 5th finger.

COMPARISONS

None.

FINDINGS

There is a minimally displaced obliquely oriented fracture of the 5th proximal phalanx diaphysis.
Bayoneting of the fracture fragments, better appreciated on the lateral projection with mild
proximal retraction of the distal fracture element. Flexion of the 5th finger is noted. There is
evidence of an old, healed 5th metacarpal fracture.

IMPRESSION

Fifth proximal phalanx fracture.

Tech Notes:

patient injured 5th right finger while playing basketball. hx of multiple hand/finger fxs

pt shielded

## 2021-06-11 ENCOUNTER — Encounter: Admit: 2021-06-11 | Discharge: 2021-06-11

## 2021-06-23 ENCOUNTER — Encounter: Admit: 2021-06-23 | Discharge: 2021-06-23 | Payer: Medicaid - Out of State

## 2021-06-23 ENCOUNTER — Inpatient Hospital Stay: Admit: 2021-06-23 | Payer: Medicaid - Out of State

## 2021-06-23 DIAGNOSIS — M549 Dorsalgia, unspecified: Secondary | ICD-10-CM

## 2021-06-23 DIAGNOSIS — F32A Depression: Secondary | ICD-10-CM

## 2021-06-23 DIAGNOSIS — F419 Anxiety disorder, unspecified: Secondary | ICD-10-CM

## 2021-06-23 DIAGNOSIS — I82409 Acute embolism and thrombosis of unspecified deep veins of unspecified lower extremity: Secondary | ICD-10-CM

## 2021-06-23 DIAGNOSIS — T1491XA Suicide attempt, initial encounter: Secondary | ICD-10-CM

## 2021-06-23 MED ORDER — IBUPROFEN 200 MG PO TAB
600 mg | ORAL | 0 refills | Status: AC | PRN
Start: 2021-06-23 — End: ?
  Administered 2021-06-24: 14:00:00 600 mg via ORAL

## 2021-06-23 MED ORDER — OLANZAPINE 10 MG IM SOLR
5 mg | INTRAMUSCULAR | 0 refills | Status: AC | PRN
Start: 2021-06-23 — End: ?

## 2021-06-23 MED ORDER — IBUPROFEN 200 MG PO TAB
400 mg | ORAL | 0 refills | Status: DC | PRN
Start: 2021-06-23 — End: 2021-06-23
  Administered 2021-06-23: 19:00:00 400 mg via ORAL

## 2021-06-23 MED ORDER — HYDROXYZINE HCL 25 MG PO TAB
25 mg | ORAL | 0 refills | Status: AC | PRN
Start: 2021-06-23 — End: ?
  Administered 2021-06-23 – 2021-06-24 (×2): 25 mg via ORAL

## 2021-06-23 MED ORDER — CALCIUM CARBONATE 200 MG CALCIUM (500 MG) PO CHEW
1000 mg | Freq: Three times a day (TID) | ORAL | 0 refills | Status: AC | PRN
Start: 2021-06-23 — End: ?

## 2021-06-23 MED ORDER — NICOTINE (POLACRILEX) 4 MG BU GUM
4 mg | BUCCAL | 0 refills | Status: AC | PRN
Start: 2021-06-23 — End: ?
  Administered 2021-06-23 – 2021-06-24 (×4): 4 mg via BUCCAL

## 2021-06-23 MED ORDER — ACETAMINOPHEN 325 MG PO TAB
650 mg | ORAL | 0 refills | Status: AC | PRN
Start: 2021-06-23 — End: ?
  Administered 2021-06-24 (×2): 650 mg via ORAL

## 2021-06-23 MED ORDER — POLYETHYLENE GLYCOL 3350 17 GRAM PO PWPK
1 | Freq: Every day | ORAL | 0 refills | Status: AC | PRN
Start: 2021-06-23 — End: ?

## 2021-06-23 MED ORDER — TRAZODONE 50 MG PO TAB
50 mg | Freq: Every evening | ORAL | 0 refills | Status: DC | PRN
Start: 2021-06-23 — End: 2021-06-23

## 2021-06-23 MED ORDER — OLANZAPINE 5 MG PO TBDI
5 mg | ORAL | 0 refills | Status: AC | PRN
Start: 2021-06-23 — End: ?
  Administered 2021-06-24: 18:00:00 5 mg via ORAL

## 2021-06-23 MED ORDER — MELATONIN 3 MG PO TAB
3 mg | Freq: Every evening | ORAL | 0 refills | Status: AC | PRN
Start: 2021-06-23 — End: ?
  Administered 2021-06-24: 01:00:00 3 mg via ORAL

## 2021-06-23 MED ORDER — NICOTINE 14 MG/24 HR TD PT24
1 | Freq: Every day | TRANSDERMAL | 0 refills | Status: AC
Start: 2021-06-23 — End: ?
  Administered 2021-06-23 – 2021-06-24 (×2): 1 via TRANSDERMAL

## 2021-06-23 MED ORDER — NICOTINE 21 MG/24 HR TD PT24
1 | Freq: Every day | TRANSDERMAL | 0 refills | Status: DC
Start: 2021-06-23 — End: 2021-06-23

## 2021-06-23 NOTE — Care Coordination-Inpatient
Care Coordination - Inpatient Note      Patient Name:   Willie Camacho                        MRN:  [id number]  Admission Date:  06/23/2021    2:12-2:36pm  CM received phone call from patient's aunt, Willie Camacho, as she requested to relay safety concerns and provide collateral information. Willie Clos expressed significant concern for patient and provided this Clinical research associate with the following information:     ? Patient has had 5 suicide attempts in the past 6 weeks   ? Patient running around the house with a butcher knife and cut his wrists-- possibly right before admission to ED   ? Ongoing concerns for safety and discharge   ? This is patient's 3rd admission in 2 weeks  ? ER three times and 2nd time in crisis stabilization unit   ? Concerns for ongoing substance use   ? Willie Clos is paying for patient's sober living in [place] and is concerned the patient may become homeless if he can't return, as he is unable to stay with family upon discharge.   ? Ongoing danger to himself-- running around with knife, continued substance use  ? Asked about possible long-term residential treatment  ? Concern for patient's persuasive and charm-- states patient can easily get what he wants from providers, as he knows what to say and what needs to be heard   ? Patient's physical behaviors have increased rapidly   ? Patient was recently bonded out of jail in [place] by aunt and has an ankle monitor, but won't stop drinking regardless of his legal obligations  ? Willie Clos states the family is Doing everything we can to help him, but he can't live with [place], and reports how the family is now scared of him due to his unsafe behaviors     Throughout phone call, CM explained further to Willie Clos the structure of the hospital program, and that since it is Friday afternoon, he won't have an assigned treatment team until Monday. Willie Clos verbalized understanding and asked for a phone call on Monday to coordinate an in-person meeting with providers to discuss these concerns, and states she can come anytime. CM to relay to treatment team. Willie Clos reports the patient has [facility] and is living at the [facility] in [place]. Patient's assigned CM to contact Charlotte Sanes to discuss the possibility of patient return to the program upon discharge.     Willie Clos continues to discuss patient's impulsivity and lack of resistance from drinking. Willie Clos states she will primarily communicate with mom about discharge plans, as it has become very distressing for patient's mom, but reiterated that the patient has his family's full support. Willie Clos states the patient continues to worsen over the years. Per Willie Clos, He's a powder keg. Willie Clos provided this Clinical research associate with updated contact info for herself and patient's mom. CM ensured any additional questions/concerns were addressed prior to ending phone call.     Willie Camacho--aunt [phone])   Willie Camacho-- mom [phone])     Willie Camacho  06/23/2021

## 2021-06-23 NOTE — Consults
Admission Date: 06/23/2021                                             LOS: 0    Active Problems:    * No active hospital problems. *        Reason for Consult: Medical management    Consult type: Co-Management w/Signed Orders    Impression  41 year old male admitted to [facility] with the following medical issues:    Suicidal ideation with recent suicide attempt  Anxiety  Depression  ADHD  Alcohol use disorder  History of lower extremity DVT  Chronic Low Back Back    Recommendations  -Resumed PTA ibuprofen and Tylenol for chronic back pain.  If pain persists can add Lidoderm patches  -Patient denies any acute medical complaints  -Management of acute psychiatric issues per primary team  -We will be happy to reassess patient if medical issues arise    The patient is admitted to inpatient psychiatric unit.  The inpatient psychiatry team is following.  Internal medicine team was consulted for medical evaluation.  I reviewed patient medical history, vitals, medications, labs and available medical records  .  Thank you for the consult , the patient is currently medically stable we will be happy to see the patient again if needed      Internal medicine can be reached out by Looking up [facility] on Voalte or Cureatr app for direct app communication    or by paging [phone]      K.Maurio Baize, APRN  Internal Medicine Consults  ____________________________________________________________________    History of Present Illness: Willie Camacho is a 41 y.o. y.o. male admitted for suicidal ideation.  Patient was discharged from [facility] yesterday with a safety plan after an intentional overdose 3 days ago.  He has had 3 suicide attempts since April.  Precipitating factors to these thoughts have been increased life stressors with the recent end of a long-term relationship.  Psychiatric HPI obtained from records review.  Today,      Medical History:   Diagnosis Date   ? Anxiety    ? Back pain ? Deep vein thrombosis (HCC) 2018   ? Depression        Surgical History:   Procedure Laterality Date   ? ELBOW SURGERY  2015   ? HAND SURGERY  2015   ? OPEN REDUCTION INTERNAL FIXATION RIGHT PROXIMAL PHALANX FRACTURE Right 08/19/2018    Performed by Diego Cory, MD at [facility] OR       Social History     Socioeconomic History   ? Marital status: Divorced   Tobacco Use   ? Smoking status: Every Day     Packs/day: 1.00     Years: 4.00     Pack years: 4.00     Types: Cigarettes   ? Smokeless tobacco: Never   Vaping Use   ? Vaping Use: Every day   ? Substances: Nicotine   Substance and Sexual Activity   ? Alcohol use: Not Currently     Alcohol/week: 4.0 standard drinks of alcohol     Types: 4 Shots of liquor per week     Comment: 6/15-6 shots & wine cooler, for past 3 days, prior to this sober for 30 days   ? Drug use: Never       Family history  reviewed; non-contributory  Family History   Problem Relation Age of Onset   ? Thyroid Disease Mother    ? Alcohol/Drug Abuse Father    ? Blood Clots Father    ? None Reported Sister    ? Asthma Brother    ? Stroke Maternal Grandmother    ? Hypertension Maternal Grandmother    ? Suicide Maternal Grandfather         died when pt mtr was 42 yo   ? Alcohol abuse Maternal Grandfather    ? Hypertension Paternal Grandmother    ? Diabetes Paternal Grandfather    ? Hypertension Paternal Grandfather        Allergies:  No Known Allergies    Scheduled Meds:  MEDSnicotine, 1 patch, Transdermal, QDAY     IV MEDS  Prn calcium carbonate TID PRN, hydrOXYzine HCL Q6H PRN 25 mg at 06/23/21 1337, ibuprofen Q6H PRN 400 mg at 06/23/21 1337, melatonin QHS PRN, nicotine polacrilex Q2H PRN, OLANZapine Q6H PRN **OR** OLANZapine Q6H PRN, polyethylene glycol 3350 QDAY PRN     HOME MEDS  Medications Prior to Admission   Medication Sig   ? acamprosate (CAMPRAL) 333 mg tablet Take two tablets by mouth three times daily.   ? ARIPiprazole (ABILIFY) 10 mg tablet Take one tablet by mouth daily.   ? doxepin (SINEQUAN) 25 mg capsule Take one capsule by mouth nightly as needed. May give another dose one hour later if first dose is ineffective   ? gabapentin (NEURONTIN) 300 mg capsule Take one capsule by mouth three times daily.   ? propranoloL (INDERAL) 10 mg tablet Take one tablet by mouth three times daily.   ? QUEtiapine (SEROQUEL) 100 mg tablet Take one tablet by mouth at bedtime daily.   ? QUEtiapine (SEROQUEL) 25 mg tablet Take one tablet by mouth twice daily as needed.   ? thiamine HCl (vitamin B1) 100 mg tablet Take one tablet by mouth daily.        Review of Systems:  14 point ROS performed and negative with exception of chronic lumbar back pain     Physical Exam:  General: alert and oriented, cooperative and no distress   Head: normocephalic, atraumatic, without obvious abnormality  Eyes:?conjunctivae/corneas clear. PERRL  Throat: Lips, mucosa, and tongue normal. Teeth and gums normal   Neck: supple, symmetrical, trachea midline, no adenopathy  Back: symmetric, no curvature. ROM normal.   Lungs: clear to auscultation bilaterally, no wheezes, rales, rhonchi   Heart: regular rate and rhythm, S1, S2 normal, no murmur  Abdomen:?bowel sounds present, nontender  Extremities: pulses palpable, no pedal edema or skin lesions   Neurologic: grossly normal, alert and oriented X 3, normal strength and tone.   Skin:?skin color, texture, turgor normal. No rashes or lesions     No pertinent radiology.

## 2021-06-23 NOTE — Group Note
Name: Willie Camacho   MRN: [id number]     DOB: 1980-08-11      Age: 41 y.o.  Admission Date: (Not on file)     LOS: 0 days     Date of Service: 06/23/2021      Group Topic: BH Wellness Plan  Group Date: 06/23/2021  Start Time: 10:15 AM  End Time: 11:00 AM  Facilitators: Anette Guarneri, RN          Number of Participants: 10  Group Focus: nursing group  Treatment Modality: Health and Mental Health Groups  Interventions utilized were patient education  Purpose:  Disease prevention and wellness- Education about the dangers of excessive sugar intake    Group Nursing Diagnosis:  Patients at risk for intake of excessive amounts of sugar related to ineffective coping and lack of knowledge regarding sugar consumption as evidenced by visualized intake of large amounts of sugary beverages and snacks and patients' verbalization of unhealthy snack options.     Interventions/Activity:  Activity: Patients were asked to choose 1 food and  a drink from a list of common sugary snacks. Patients were then handed sugar packets representing the equivalent of sugar contained in each snack.   Discussion: Patients compared how much excess sugar their snack choices contained compared to the recommended daily intake amount. This RN led a discussion with patients about the effects of excessive sugar consumption and discussed foods that "seem" health but contain a lot of added sugar.   Expected outcome: Patients will verbalize why excessive sugar consumption can be harmful and identify alternatives to high sugar snacks.           Name: Willie Camacho Date of Birth: 06/19/1980   MR: [id number]      Level of Participation: patient not present for group  Plan: to continue treatment

## 2021-06-23 NOTE — Progress Notes
N2N Report:   Name/Title/Phone Number of person providing N2N Information: [phone] Ally RN     Location of Patient:  [facility]    Allergies NKA     Most Recent Vitals:  BP 115/79   HR 73    RR 18    SPO2 98    Temp 98.7        Pain? None reported      Current Psychiatric Symptoms:    Pt reports SI/SA via cutting his wrist that required 4 sutures, ~3.75cm length following drinking 6 shots of liquor an a wine cooler yesterday.      Pt was discharged from [facility] on yesterday with a safety plan after an intentional OD 3 days ago. He reports he does not want to die but once he is triggered, he is unable to stop himself from acting on self harm thoughts. Patient has attempted SI 3 times since April 2023. Patent reports low motivation that is impacting his desire to complete ADL?s - he is independent. Patient also reports anxiety and hopelessness with irritability. Recent life stressors include end of a long term relationship. Patient reports he is out of his current medications due to his recent OD but does see a therapist.  Patient has legal issues that require him to wear a SCRAM ankle monitor for ETOH     Psychosocial (Voluntary Status, DPOA/Guardian, Social Support, Current Living Situation, etc.):  Voluntary, own guardian, Sober living     Medical Interventions Needed or PRN's:  Lidocaine 6/15  Seroquol 100 mg 6/15 @ HS  Ativan 1 mg PO 6/16 @ 0915     Medical Issues and Diagnosis:  SI/SA  ADHD  ETOH abuse  GAD  Depression  Hx-DVT lower ext     Medications and Compliance:  Acamprosate 666 mg  Abilify 10 mg  Doxepin 25 mg  Gabapentin 300 mg TID  Propranolol 10 mg TID  Seroquel 100 mg HS and 25 mg PRN x2     A&O x4? Yes  Eating/Drinking? Ate breakfast with fluids  Ambulating? Indpendently  Urinating? Voiding no issues  Skin concerns?  Other then left wrist, no other skin issues noted      Labs Completed?   COVID (-)  ETOH 85.7  UDS Unremarkable        Additional Information:  Has been mostly calm (limited anxiety-PRN  Given), cooperative and pleasant with staff and cares     ETA? Pending

## 2021-06-23 NOTE — Care Plan
Problem: Discharge Planning  Goal: Participation in plan of care  Outcome: Goal Ongoing  Goal: Knowledge regarding plan of care  Outcome: Goal Ongoing  Goal: Prepared for discharge  Outcome: Goal Ongoing     Problem: Harm to self, high risk of suicide  Goal: Absence of Harm to Self  Outcome: Goal Ongoing     Problem: Tobacco Use  Goal: Knowledge of tobacco-use cessation methods  Outcome: Goal Ongoing     Problem: Pain  Goal: Management of pain  Outcome: Goal Ongoing  Goal: Knowledge of pain management  Outcome: Goal Ongoing     Problem: Anxiety  Goal: Alleviation of anxiety  Outcome: Goal Ongoing

## 2021-06-23 NOTE — Group Note
Name: Willie Camacho   MRN: [id number]     DOB: 22-Jul-1980      Age: 40 y.o.  Admission Date: (Not on file)     LOS: 0 days     Date of Service: 06/23/2021      Group Topic: BH Coping Skills  Group Date: 06/23/2021  Start Time: 11:00 AM  End Time: 12:00 PM  Facilitators: Patric Dykes, PSYD          Number of Participants: 10  Group Focus: anxiety, depression, and feeling awareness/expression  Treatment Modality: Group Psychotherapy and Psychoeducation  Interventions utilized were group exercise and patient education  Purpose: enhance coping skills and regain self-worth    Patients expressed a desire to speak about emotional stabilization and meditation.  Writer engaged patients in a discussion of the nature of emotions and their myriad causes, likening them to the weather.  Writer led patients through a deep breathing exercise and instructed them about how to build a Scientist, research (physical sciences).  Writer explicated the many benefits of engaging in a meditation practice in terms of emotion regulation.            Name: Willie Camacho Date of Birth: 1980-06-10   MR: [id number]      Level of Participation: patient not present for group

## 2021-06-23 NOTE — Group Note
Name: Mallory Enriques "Sahith Nurse   MRN: [id number]     DOB: 1980-01-13      Age: 41 y.o.  Admission Date: 06/23/2021     LOS: 0 days     Date of Service: 06/23/2021      Group Topic: BH Leisure Activities  Group Date: 06/23/2021  Start Time: 1245  End Time: 1345  Facilitators: Garner Nash          Number of Participants: 10  Group Focus: communication, concentration, coping skills, feeling awareness/expression, leisure skills, music therapy, relaxation, reminiscence, and social skills  Treatment Modality: Music Therapy  Interventions utilized were other MixTape  Purpose: enhance coping skills, express feelings, improve communication skills, and reinforce self-care    Mixtape: The patients played the game "Mixtape" in which patients drew a scenario card and chose a song fitting that scenario. The purpose of this intervention was to increase coping skills, leisure skills, communication, and feeling awareness/expression.      Name: Edwen Mclester" Date of Birth: 29-Apr-1980   MR: [id number]      Level of Participation: patient not present for group   Plan: to continue treatment
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
## 2021-06-24 NOTE — Care Plan
Problem: Harm to self, high risk of suicide  Goal: Absence of Harm to Self  Outcome: Goal Ongoing  Flowsheets (Taken 06/23/2021 2034)  Absence of harm to self:   Assess and report significant changes in mood and affect   Complete the environmental risk assessment   Maintain a safe environment   Reassess for suicide risk daily     Problem: Health Maintenance - Impaired  Goal: Establish therapeutic relationships  Outcome: Goal Ongoing  Flowsheets (Taken 06/23/2021 2034)  Establish therapeutic relationships:   Encourage expressions of feelings   Assist patient in setting realistic expectations   Involve patient in decision-making process     Problem: Mood - Altered  Goal: Stabilize mood  Outcome: Goal Ongoing  Flowsheets (Taken 06/23/2021 2034)  Stabilize mood:   Assess coping style   Assess ineffective coping signs and symptoms   Assess depression history   Establish therapeutic relationships   Assess depressive symptoms     Problem: Violence, self/other-directed, Risk of  Goal: Absence of violence  Outcome: Goal Ongoing  Flowsheets (Taken 06/23/2021 2034)  Absence of violence:   Suicide assessment   Suicide precautions   Violent behavior risk assessment   Environmental safety management   Supervise medication intake     Problem: Coping - Ineffective, Family  Goal: Effective Coping  Outcome: Goal Ongoing  Flowsheets (Taken 06/23/2021 2034)  Effective coping:   Assess support system   Provide coping support

## 2023-05-21 ENCOUNTER — Encounter: Admit: 2023-05-21 | Discharge: 2023-05-21 | Payer: Medicaid - Out of State

## 2023-05-22 ENCOUNTER — Encounter: Admit: 2023-05-22 | Discharge: 2023-05-22 | Payer: Medicaid - Out of State

## 2023-07-30 ENCOUNTER — Inpatient Hospital Stay: Admit: 2023-07-30 | Discharge: 2023-07-30 | Payer: Medicaid - Out of State

## 2023-07-30 ENCOUNTER — Encounter: Admit: 2023-07-30 | Discharge: 2023-07-30 | Payer: Medicaid - Out of State

## 2023-10-11 ENCOUNTER — Encounter: Admit: 2023-10-11 | Discharge: 2023-10-11 | Payer: MEDICAID

## 2023-10-11 NOTE — Progress Notes
 Benign Hematology Triage  Date: 10.3.25  Diagnosis/Reason for Referral: hx thrombosis (DVT)   Recommendations: first available Dr Jama Sabal, [place]   Records Location: faxed  Intake Coordinator to Schedule: yes/no: Yes  Intake Coordinator to ask VTE questions:  Yes

## 2023-10-14 ENCOUNTER — Encounter: Admit: 2023-10-14 | Discharge: 2023-10-14 | Payer: MEDICAID

## 2023-10-14 NOTE — Telephone Encounter
 Patient declined to be seen at this time. He will reach out to us  if he decides to proceed.
